# Patient Record
Sex: Male | Born: 1990 | Race: White | Hispanic: No | Marital: Single | Smoking: Current every day smoker
Health system: Southern US, Community
[De-identification: ages and names within clinical notes are randomized; demographics above are authoritative.]

---

## 2015-03-24 ENCOUNTER — Encounter (HOSPITAL_COMMUNITY): Payer: Self-pay | Admitting: Emergency Medicine

## 2015-03-24 ENCOUNTER — Emergency Department (HOSPITAL_COMMUNITY)
Admission: EM | Admit: 2015-03-24 | Discharge: 2015-03-24 | Disposition: A | Discharge status: Home / Self Care | Payer: Self-pay | Attending: Emergency Medicine | Admitting: Emergency Medicine

## 2015-03-24 DIAGNOSIS — H66001 Acute suppurative otitis media without spontaneous rupture of ear drum, right ear: Secondary | ICD-10-CM | POA: Insufficient documentation

## 2015-03-24 DIAGNOSIS — Z720 Tobacco use: Secondary | ICD-10-CM | POA: Insufficient documentation

## 2015-03-24 DIAGNOSIS — J029 Acute pharyngitis, unspecified: Secondary | ICD-10-CM | POA: Insufficient documentation

## 2015-03-24 MED ORDER — AMOXICILLIN 500 MG PO CAPS: 500.0000 mg | ORAL_CAPSULE | Freq: Three times a day (TID) | ORAL | Status: AC

## 2015-03-24 MED ORDER — ACETAMINOPHEN 500 MG PO TABS
1000.0000 mg | ORAL_TABLET | Freq: Once | ORAL | Status: AC
  Administered 2015-03-24: 1000 mg via ORAL
  Filled 2015-03-24: qty 2

## 2015-03-24 NOTE — ED Provider Notes (Signed)
CSN: [id number]     Arrival date & time 03/24/15  1034 History   First MD Initiated Contact with Patient 03/24/15 1123     Chief Complaint  Patient presents with  . Otalgia     (Consider location/radiation/quality/duration/timing/severity/associated sxs/prior Treatment) Patient is a 24 y.o. male presenting with ear pain. The history is provided by the patient and medical records. No language interpreter was used.  Otalgia Associated symptoms: congestion, headaches, rhinorrhea and sore throat   Associated symptoms: no abdominal pain, no cough, no diarrhea, no ear discharge, no fever, no rash and no vomiting      Steve Fischer is a 24 y.o. male  with no major medical history presents to the Emergency Department complaining of gradual, persistent, progressively worsening right ear otalgia onset this morning. Patient reports that he has had URI symptoms for the last 3 or 4 days with associated headaches, rhinorrhea, sinus congestion, and sore throat. He reports taking ibuprofen for this with moderate relief until this morning. Patient reports a distant history of right ear infection with complicating mastoiditis. He reports no ear infections since that time. He denies fevers at home and is afebrile on arrival here in the emergency department.  He's had no nausea or vomiting. He reports no difficulty swallowing today and improvement in his sore throat. Nothing seems to make his ear pain better or worse. He denies neck pain, neck stiffness, changes in vision, chest pain, shortness of breath abdominal pain, nausea, vomiting, diarrhea.  History reviewed. No pertinent past medical history. History reviewed. No pertinent past surgical history. No family history on file. History  Substance Use Topics  . Smoking status: Current Every Day Smoker  . Smokeless tobacco: Not on file  . Alcohol Use: No    Review of Systems  Constitutional: Negative for fever, chills, appetite change and fatigue.   HENT: Positive for congestion, ear pain, postnasal drip, rhinorrhea, sinus pressure and sore throat. Negative for ear discharge and mouth sores.   Eyes: Negative for visual disturbance.  Respiratory: Negative for cough, chest tightness, shortness of breath, wheezing and stridor.   Cardiovascular: Negative for chest pain, palpitations and leg swelling.  Gastrointestinal: Negative for nausea, vomiting, abdominal pain and diarrhea.  Genitourinary: Negative for dysuria, urgency, frequency and hematuria.  Musculoskeletal: Negative for myalgias, back pain, arthralgias and neck stiffness.  Skin: Negative for rash.  Neurological: Positive for headaches. Negative for syncope, light-headedness and numbness.  Hematological: Negative for adenopathy.  Psychiatric/Behavioral: The patient is not nervous/anxious.   All other systems reviewed and are negative.     Allergies  Review of patient's allergies indicates no known allergies.  Home Medications   Prior to Admission medications   Medication Sig Start Date End Date Taking? Authorizing Provider  acetaminophen (TYLENOL) 500 MG tablet Take 1,000 mg by mouth every 6 (six) hours as needed for moderate pain.   Yes Historical Provider, MD  ibuprofen (ADVIL,MOTRIN) 200 MG tablet Take 600-800 mg by mouth every 6 (six) hours as needed for moderate pain.    Yes Historical Provider, MD  amoxicillin (AMOXIL) 500 MG capsule Take 1 capsule (500 mg total) by mouth 3 (three) times daily. 03/24/15   Aziz Slape, PA-C   BP 136/80 mmHg  Pulse 91  Temp(Src) 98.7 F (37.1 C) (Oral)  Resp 14  Ht 6' (1.829 m)  Wt 230 lb (104.327 kg)  BMI 31.19 kg/m2  SpO2 95% Physical Exam  Constitutional: He is oriented to person, place, and time. He appears well-developed and  well-nourished. No distress.  HENT:  Head: Normocephalic and atraumatic.  Right Ear: External ear and ear canal normal. No mastoid tenderness. Tympanic membrane is injected and bulging. A middle  ear effusion is present. No hemotympanum.  Left Ear: Tympanic membrane, external ear and ear canal normal. No mastoid tenderness. Tympanic membrane is not injected and not bulging.  No middle ear effusion. No hemotympanum.  Nose: Mucosal edema and rhinorrhea present. No epistaxis. Right sinus exhibits no maxillary sinus tenderness and no frontal sinus tenderness. Left sinus exhibits no maxillary sinus tenderness and no frontal sinus tenderness.  Mouth/Throat: Uvula is midline and mucous membranes are normal. Mucous membranes are not pale, not dry and not cyanotic. No trismus in the jaw. No uvula swelling. No oropharyngeal exudate, posterior oropharyngeal edema, posterior oropharyngeal erythema or tonsillar abscesses.  Posterior oropharynx with erythema and mild edema but without exudate on the tonsils Right TM with purulent effusion, erythema and bulging. No erythema or tenderness to palpation of the mastoid No tenderness to palpation or movement of the tragus  Eyes: Conjunctivae are normal. Pupils are equal, round, and reactive to light.  Neck: Normal range of motion, full passive range of motion without pain and phonation normal. No tracheal tenderness, no spinous process tenderness and no muscular tenderness present. No rigidity. No erythema and normal range of motion present. No Brudzinski's sign and no Kernig's sign noted.  Range of motion without pain  No midline or paraspinal tenderness Normal phonation No stridor Handling secretions without difficulty No nuchal rigidity or meningeal signs  Cardiovascular: Normal rate, regular rhythm, normal heart sounds and intact distal pulses.   Pulses:      Radial pulses are 2+ on the right side, and 2+ on the left side.  Pulmonary/Chest: Effort normal and breath sounds normal. No stridor. No respiratory distress. He has no decreased breath sounds. He has no wheezes.  Clear and equal breath sounds without focal wheezes, rhonchi, rales  Abdominal:  Soft. Bowel sounds are normal. There is no tenderness.  Musculoskeletal: Normal range of motion.  Lymphadenopathy:       Head (right side): Submandibular and tonsillar adenopathy present. No submental, no preauricular, no posterior auricular and no occipital adenopathy present.       Head (left side): Submandibular and tonsillar adenopathy present. No submental, no preauricular, no posterior auricular and no occipital adenopathy present.    He has no cervical adenopathy.       Right cervical: No superficial cervical, no deep cervical and no posterior cervical adenopathy present.      Left cervical: No superficial cervical, no deep cervical and no posterior cervical adenopathy present.  Neurological: He is alert and oriented to person, place, and time.  Alert and oriented Moves all extremities without ataxia  Skin: Skin is warm and dry. No rash noted. He is not diaphoretic.  Psychiatric: He has a normal mood and affect.  Nursing note and vitals reviewed.   ED Course  Procedures (including critical care time) Labs Review Labs Reviewed - No data to display  Imaging Review No results found.   EKG Interpretation None      MDM   Final diagnoses:  Acute suppurative otitis media of right ear without spontaneous rupture of tympanic membrane, recurrence not specified   Arturo MortonGilbert Dauphine presents with URI symptoms for several days with onset of otalgia today. Patient reports history of complicating mastoiditis however there is no evidence of this today on exam. He is afebrile without tenderness or erythema to the  mastoid. Patient with obvious right otitis media.  No antibiotics in the last 6 months. Will give amoxicillin.  Recommended Tylenol and ibuprofen for pain and fever control. Strict return precautions given.  Patient is to follow-up with his primary care provider within 2 days for reassessment. If he is unable to do this he should present to an urgent care or back to the emergency  department for recheck.  BP 136/80 mmHg  Pulse 91  Temp(Src) 98.7 F (37.1 C) (Oral)  Resp 14  Ht 6' (1.829 m)  Wt 230 lb (104.327 kg)  BMI 31.19 kg/m2  SpO2 95%   Dierdre Forth, PA-C 03/24/15 1210  Toy Cookey, MD 03/24/15 2117

## 2015-03-24 NOTE — Discharge Instructions (Signed)
1. Medications: Amoxicillin, usual home medications 2. Treatment: rest, drink plenty of fluids, tylenol and ibuprofen as needed for pain and fever control 3. Follow Up: Please followup with your primary doctor in 2 days for recheck of your ear; if you do not have a primary care doctor use the resource guide provided to find one; Please return to the ER for high fever, purulent ear drainage, worsening symptoms or other concerns    Otitis Media With Effusion Otitis media with effusion is the presence of fluid in the middle ear. This is a common problem in children, which often follows ear infections. It may be present for weeks or longer after the infection. Unlike an acute ear infection, otitis media with effusion refers only to fluid behind the ear drum and not infection. Children with repeated ear and sinus infections and allergy problems are the most likely to get otitis media with effusion. CAUSES  The most frequent cause of the fluid buildup is dysfunction of the eustachian tubes. These are the tubes that drain fluid in the ears to the back of the nose (nasopharynx). SYMPTOMS   The main symptom of this condition is hearing loss. As a result, you or your child may:  Listen to the TV at a loud volume.  Not respond to questions.  Ask "what" often when spoken to.  Mistake or confuse one sound or word for another.  There may be a sensation of fullness or pressure but usually not pain. DIAGNOSIS   Your health care provider will diagnose this condition by examining you or your child's ears.  Your health care provider may test the pressure in you or your child's ear with a tympanometer.  A hearing test may be conducted if the problem persists. TREATMENT   Treatment depends on the duration and the effects of the effusion.  Antibiotics, decongestants, nose drops, and cortisone-type drugs (tablets or nasal spray) may not be helpful.  Children with persistent ear effusions may have  delayed language or behavioral problems. Children at risk for developmental delays in hearing, learning, and speech may require referral to a specialist earlier than children not at risk.  You or your child's health care provider may suggest a referral to an ear, nose, and throat surgeon for treatment. The following may help restore normal hearing:  Drainage of fluid.  Placement of ear tubes (tympanostomy tubes).  Removal of adenoids (adenoidectomy). HOME CARE INSTRUCTIONS   Avoid secondhand smoke.  Infants who are breastfed are less likely to have this condition.  Avoid feeding infants while they are lying flat.  Avoid known environmental allergens.  Avoid people who are sick. SEEK MEDICAL CARE IF:   Hearing is not better in 3 months.  Hearing is worse.  Ear pain.  Drainage from the ear.  Dizziness. MAKE SURE YOU:   Understand these instructions.  Will watch your condition.  Will get help right away if you are not doing well or get worse. Document Released: 12/29/2004 Document Revised: 04/07/2014 Document Reviewed: 06/18/2013 [facility] Patient Information 2015 [place], [place]. This information is not intended to replace advice given to you by your health care provider. Make sure you discuss any questions you have with your health care provider.    Emergency Department Resource Guide 1) Find a Doctor and Pay Out of Pocket Although you won't have to find out who is covered by your insurance plan, it is a good idea to ask around and get recommendations. You will then need to call the office  and see if the doctor you have chosen will accept you as a new patient and what types of options they offer for patients who are self-pay. Some doctors offer discounts or will set up payment plans for their patients who do not have insurance, but you will need to ask so you aren't surprised when you get to your appointment.  2) Contact Your Local Health Department Not all health  departments have doctors that can see patients for sick visits, but many do, so it is worth a call to see if yours does. If you don't know where your local health department is, you can check in your phone book. The CDC also has a tool to help you locate your state's health department, and many state websites also have listings of all of their local health departments.  3) Find a Walk-in Clinic If your illness is not likely to be very severe or complicated, you may want to try a walk in clinic. These are popping up all over the country in pharmacies, drugstores, and shopping centers. They're usually staffed by nurse practitioners or physician assistants that have been trained to treat common illnesses and complaints. They're usually fairly quick and inexpensive. However, if you have serious medical issues or chronic medical problems, these are probably not your best option.  No Primary Care Doctor: - Call Health Connect at  [phone] - they can help you locate a primary care doctor that  accepts your insurance, provides certain services, etc. - Physician Referral Service- [phone]  Chronic Pain Problems: Organization         Address  Phone   Notes  [facility]  [phone]) [phone] Patients need to be referred by their primary care doctor.   Medication Assistance: Organization         Address  Phone   Notes  [facility] [address] [place], [place] [address] [phone] --Must be a resident of [place] -- Must have NO insurance coverage whatsoever (no Medicaid/ Medicare, etc.) -- The pt. MUST have a primary care doctor that directs their care regularly and follows them in the community   [facility]ist  [phone]) [phone]   [employer]  [phone]) [phone]    Agencies that provide inexpensive medical care: Organization         Address  Phone   Notes  [facility]  [phone]) [phone]   [facility]     [phone]) [phone]   [facility] [address] [place], [place] [address] [phone]   [facility] [address], [place] [phone]   [facility]anned Parenthood    [phone]   [facility]    [phone]) [phone]   [facility]  [address], [place] Phone:  [phone], Fax:  [phone]) [phone] Hours of Operation:  9 am - 6 pm, M-F.  Also accepts Medicaid/Medicare and self-pay.  [facility]  [address], [place] Phone: [phone]) [phone], Fax: [phone]. Hours of Operation:  8:30 am - 5:30 pm, M-F.  Also accepts Medicaid and self-pay.  [facility] [place] [address], [place] Phone: [phone]) [phone]   [facility] [address], [place], [place] [phone], Ext. [phone] Mondays & Thursdays: 7-9 AM.  First 15 patients are seen on a first come, first serve basis.    Medicaid-accepting [place] Providers:  Organization         Address  Phone   Notes  [facility] [address], [place] [phone] Also accepts self-pay patients.  [facility] [address] Laurell Josephs [place], [place]  [phone]   [facility] [address], [place] [phone]   [facility] [address], [place] [phone]   Renaye Rakers [address], [place]   [phone] Only accepts [place] Access [place] patients after they have their name applied to their card.   Self-Pay (no insurance) in [facility]:  Organization         Address  Phone   Notes  Sickle Cell Patients, [facility] Internal Medicine [address] [place], [place] [phone]   [facility] Urgent Care [address] [place], [place] [phone]   [facility]  [address], [place] [phone]     [facility]/Dr. Osei-Bonsu  [address] [place] or [address], [place] [phone] Phone number for both [place] and [place] locations is the same.  [facility]gent [facility] and [facility] [address], [place] [phone]   [facility] [address], [place] or [address] [phone]   [facility] [address] [place] [address] [place] [phone], phone; [phone], fax Sees patients 1st and 3rd Saturday of every month.  Must not qualify for public or private insurance (i.e. Medicaid, Medicare, [place] Health Choice, Veterans' Benefits)  Household income should be no more than 200% of the poverty level The clinic cannot treat you if you are pregnant or think you are pregnant  Sexually transmitted diseases are not treated at the clinic.    Dental Care: Organization         Address  Phone  Notes  [facility] [address] [place], [place] [phone] Accepts children up to age 54 who are enrolled in [place] or [place] Health Choice; pregnant women with a Medicaid card; and children who have applied for Medicaid or [place] Health Choice, but were declined, whose parents can pay a reduced fee at time of service.  [facility] Department of [facility]  [address], [place] [phone] Accepts children up to age 41 who are enrolled in [place] or [place] Health Choice; pregnant women with a Medicaid card; and children who have applied for Medicaid or  Health Choice, but were declined, whose parents can pay a reduced fee at time of service.  [facility]  [address] [place], [place] [phone] Patients are seen by appointment only. Walk-ins are not accepted. [facility] will see patients 26 years of age and older. Monday - Tuesday (8am-5pm) Most Wednesdays (8:30-5pm) $30 per visit,  cash only  [facility] Access PRO[facility]  [address], [facility] [phone] Patients are seen by appointment only. Walk-ins are not accepted. [facility] will see patients 62 years of age and older. One Wednesday Evening (Monthly: Volunteer Based).  $30 per visit, cash only  [employer]  [phone] for adults; Children under age 49, call Graduate Pediatric Dentistry at [phone]. Children aged 50-14, please call [phone] to request a pediatric  application.  Dental services are provided in all areas of dental care including fillings, crowns and bridges, complete and partial dentures, implants, gum treatment, root canals, and extractions. Preventive care is also provided. Treatment is provided to both adults and children. Patients are selected via a lottery and there is often a waiting list.   [facility] [address], [place]  [phone] [email or web address]   [facility] [address] [place], [place] [phone], Ext. [phone] Second and Fourth Thursday of each month, opens at 6:30 AM; Clinic ends at 9 AM.  Patients are seen on a first-come first-served basis, and a limited number are seen during each clinic.   [facility]  [address] Ether Griffins [place], [place] [phone]   Eligibility Requirements You must have lived in [place], [place], or [place] counties for at least the last three months.   You cannot be eligible for state or federal sponsored [place], including [employer], [place], or [employer].   You generally cannot be eligible for healthcare insurance through your employer.    How to apply: Eligibility screenings are held every Tuesday and Wednesday afternoon from 1:00 pm until 4:00 pm. You do not need an appointment for the interview!  [facility] [address], [place], [place] [phone]   [facility] Health Department   [phone]   [facility] Health Department  [phone]   [facility]  [phone]    Behavioral Health Resources in the Community: Intensive Outpatient Programs Organization         Address  Phone  Notes  [facility] Services [address], [place], [place] [phone]   [facility] Outpatient [address], [place], [place] [phone]   [facility] [address], [place], [place]  [phone]   [facility] Mental Health [address]  [place], [place] [phone] or [phone]   Substance Abuse Resources Organization         Address  Phone  Notes  [facility] Drug Services  [phone]   [facility]  [phone]   [facility] [place]  [phone]   Floydene Flock  [phone]   Residential & Outpatient Substance Abuse Program  [phone]   Psychological Services Organization         Address  Phone  Notes  [facility]  [phone]   [facility] Services  [phone]   [facility] [address] [place] [phone] or [phone]    Mobile Crisis Teams Organization         Address  Phone  Notes  [facility], Mobile Crisis Care Unit  [phone]   [facility]  [address] [place], [place] [phone]   Doristine Locks [address] [place] [phone]    Self-Help/Support Groups Organization         Address  Phone             Notes  [facility] [place] - variety of support groups  [phone]- I7437963 Call for more information  Narcotics Anonymous (NA), [facility] [address], [employer] [place]  2 meetings at this location   Statistician         Address  Phone  Notes  [facility] [address],    [place]  [phone]   [facility]  [address], [place] [phone], [place],  [facility] [phone]    [facility] Residential Treatment Facility [address] [place], [place] [phone] Admissions: 8am-3pm M-F  [facility] [address],    [place], [place] [phone]   [facility] [address] [place], [place], [place] [phone]   [facility] [address],  [place], [place] [phone]   [facility] - Intensive Outpatient [address], [place], [place] [phone]   [facility] ([facility] [address] [place]  [place], [place] [phone] or [phone]   [facility] ([facility]) [address], [place], [place] [phone] Accepts Medicaid  [facility] [address]  [place] [phone] Substance Abuse/Addiction Treatment   [facility] Organization         Address  Phone  Notes  [facility]  [phone]   Angie Fava, PhD [address] Ervin Knack [place], [place]   [phone] or [phone]   [facility]   [address] [place], [place] [phone]   [facility] [address], [place], [place] [phone] Insurance/Medicaid/sponsorship through [facility] and Families [address]                                    [place], [place] [phone] Therapy/tele-psych/case  [facility] [address], [place] [phone]    Dr. Lolly Mustache  [phone]   [facility]  United [employer] [facility] Dept. 1) [address] [place] 2) [address] [place] 3)  [address], [place] [phone]  [phone]   [facility] Child Abuse Hotline [phone] or [phone] (After Hours)

## 2015-03-24 NOTE — ED Notes (Signed)
Pt reports right ear pain that started this am when he woke but has progressively become worse. Pt with recent sinus congestion and cough.

## 2015-07-22 ENCOUNTER — Encounter (HOSPITAL_COMMUNITY): Payer: Self-pay | Admitting: Emergency Medicine

## 2015-07-22 ENCOUNTER — Emergency Department (HOSPITAL_COMMUNITY)
Admission: EM | Admit: 2015-07-22 | Discharge: 2015-07-22 | Disposition: A | Discharge status: Home / Self Care | Payer: Self-pay | Attending: Emergency Medicine | Admitting: Emergency Medicine

## 2015-07-22 ENCOUNTER — Emergency Department (HOSPITAL_COMMUNITY): Payer: Self-pay

## 2015-07-22 DIAGNOSIS — Z720 Tobacco use: Secondary | ICD-10-CM | POA: Insufficient documentation

## 2015-07-22 DIAGNOSIS — Z79899 Other long term (current) drug therapy: Secondary | ICD-10-CM | POA: Insufficient documentation

## 2015-07-22 DIAGNOSIS — J40 Bronchitis, not specified as acute or chronic: Secondary | ICD-10-CM | POA: Insufficient documentation

## 2015-07-22 DIAGNOSIS — R1110 Vomiting, unspecified: Secondary | ICD-10-CM | POA: Insufficient documentation

## 2015-07-22 LAB — URINALYSIS, ROUTINE W REFLEX MICROSCOPIC
Bilirubin Urine: NEGATIVE
Glucose, UA: NEGATIVE mg/dL
HGB URINE DIPSTICK: NEGATIVE
Ketones, ur: NEGATIVE mg/dL
LEUKOCYTES UA: NEGATIVE
Nitrite: NEGATIVE
PROTEIN: NEGATIVE mg/dL
SPECIFIC GRAVITY, URINE: 1.03 (ref 1.005–1.030)
UROBILINOGEN UA: 0.2 mg/dL (ref 0.0–1.0)
pH: 5.5 (ref 5.0–8.0)

## 2015-07-22 LAB — COMPREHENSIVE METABOLIC PANEL
ALBUMIN: 4.7 g/dL (ref 3.5–5.0)
ALT: 38 U/L (ref 17–63)
ANION GAP: 10 (ref 5–15)
AST: 31 U/L (ref 15–41)
Alkaline Phosphatase: 64 U/L (ref 38–126)
BILIRUBIN TOTAL: 0.8 mg/dL (ref 0.3–1.2)
BUN: 22 mg/dL — ABNORMAL HIGH (ref 6–20)
CO2: 26 mmol/L (ref 22–32)
Calcium: 9.6 mg/dL (ref 8.9–10.3)
Chloride: 102 mmol/L (ref 101–111)
Creatinine, Ser: 0.94 mg/dL (ref 0.61–1.24)
GFR calc non Af Amer: >60 mL/min (ref >60)
Glucose, Bld: 92 mg/dL (ref 65–99)
Potassium: 4.1 mmol/L (ref 3.5–5.1)
Sodium: 138 mmol/L (ref 135–145)
TOTAL PROTEIN: 9.2 g/dL — AB (ref 6.5–8.1)

## 2015-07-22 LAB — CBC
HCT: 47.5 % (ref 39.0–52.0)
HEMOGLOBIN: 16 g/dL (ref 13.0–17.0)
MCH: 30.8 pg (ref 26.0–34.0)
MCHC: 33.7 g/dL (ref 30.0–36.0)
MCV: 91.3 fL (ref 78.0–100.0)
Platelets: 274 10*3/uL (ref 150–400)
RBC: 5.2 MIL/uL (ref 4.22–5.81)
RDW: 12.5 % (ref 11.5–15.5)
WBC: 10.1 10*3/uL (ref 4.0–10.5)

## 2015-07-22 LAB — RAPID STREP SCREEN (MED CTR MEBANE ONLY): STREPTOCOCCUS, GROUP A SCREEN (DIRECT): NEGATIVE

## 2015-07-22 LAB — LIPASE, BLOOD: Lipase: 28 U/L (ref 22–51)

## 2015-07-22 MED ORDER — DEXTROMETHORPHAN HBR 15 MG/5ML PO SYRP: 10.0000 mL | ORAL_SOLUTION | Freq: Four times a day (QID) | ORAL | Status: AC | PRN

## 2015-07-22 MED ORDER — ALBUTEROL SULFATE HFA 108 (90 BASE) MCG/ACT IN AERS: 1.0000 | INHALATION_SPRAY | Freq: Four times a day (QID) | RESPIRATORY_TRACT | Status: AC | PRN

## 2015-07-22 MED ORDER — PSEUDOEPHEDRINE HCL 30 MG PO TABS: 30.0000 mg | ORAL_TABLET | ORAL | Status: AC | PRN

## 2015-07-22 MED ORDER — ONDANSETRON 4 MG PO TBDP: 4.0000 mg | ORAL_TABLET | Freq: Once | ORAL | Status: DC

## 2015-07-22 NOTE — ED Provider Notes (Signed)
CSN: [id number]     Arrival date & time 07/22/15  1810 History   First MD Initiated Contact with Patient 07/22/15 1951     Chief Complaint  Patient presents with  . Emesis  . Sore Throat     (Consider location/radiation/quality/duration/timing/severity/associated sxs/prior Treatment) HPI Steve Fischer is a 24 y.o. male with no medical problems, presents to emergency department complaining of sore throat, nasal congestion, cough, post tussive emesis. Patient states symptoms started yesterday. Patient states "I have a world war 7 and my body." Patient states he has been taking over-the-counter cough syrup with no relief of his symptoms. Patient has not tried any other medications. Patient states that his roommate was recently sick with a GI bug. Patient denies abdominal pain or diarrhea. He states he is vomiting only after coughing. Patient denies any fever or chills. No headache or neck stiffness. No back pain. Denies shortness of breath.  History reviewed. No pertinent past medical history. History reviewed. No pertinent past surgical history. No family history on file. Social History  Substance Use Topics  . Smoking status: Current Every Day Smoker    Types: Cigarettes  . Smokeless tobacco: None  . Alcohol Use: No    Review of Systems  Constitutional: Negative for fever and chills.  HENT: Positive for congestion and sore throat. Negative for ear pain and voice change.   Respiratory: Positive for cough. Negative for chest tightness and shortness of breath.   Cardiovascular: Negative for chest pain, palpitations and leg swelling.  Gastrointestinal: Positive for vomiting. Negative for nausea, abdominal pain, diarrhea and abdominal distention.  Genitourinary: Negative for dysuria, urgency, frequency and hematuria.  Musculoskeletal: Negative for myalgias, arthralgias, neck pain and neck stiffness.  Skin: Negative for rash.  Allergic/Immunologic: Negative for immunocompromised state.   Neurological: Negative for dizziness, weakness, light-headedness, numbness and headaches.  All other systems reviewed and are negative.     Allergies  Review of patient's allergies indicates no known allergies.  Home Medications   Prior to Admission medications   Medication Sig Start Date End Date Taking? Authorizing Provider  guaifenesin (COUGH SYRUP) 100 MG/5ML syrup Take 200 mg by mouth 3 (three) times daily as needed for cough.   Yes Historical Provider, MD  pseudoephedrine (SUDAFED) 30 MG tablet Take 30 mg by mouth every 4 (four) hours as needed for congestion.   Yes Historical Provider, MD  albuterol (PROVENTIL HFA;VENTOLIN HFA) 108 (90 BASE) MCG/ACT inhaler Inhale 1-2 puffs into the lungs every 6 (six) hours as needed for wheezing or shortness of breath. 07/22/15   Jushua Waltman, PA-C  amoxicillin (AMOXIL) 500 MG capsule Take 1 capsule (500 mg total) by mouth 3 (three) times daily. Patient not taking: Reported on 07/22/2015 03/24/15   Dahlia Client Muthersbaugh, PA-C  dextromethorphan 15 MG/5ML syrup Take 10 mLs (30 mg total) by mouth 4 (four) times daily as needed for cough. 07/22/15   Toris Laverdiere, PA-C  pseudoephedrine (SUDAFED) 30 MG tablet Take 1 tablet (30 mg total) by mouth every 4 (four) hours as needed for congestion. 07/22/15   Kiran Carline, PA-C   BP 135/86 mmHg  Pulse 96  Temp(Src) 98.6 F (37 C) (Oral)  Resp 17  Ht 6' (1.829 m)  Wt 230 lb (104.327 kg)  BMI 31.19 kg/m2  SpO2 99% Physical Exam  Constitutional: He appears well-developed and well-nourished. No distress.  HENT:  Head: Normocephalic and atraumatic.  Right Ear: Tympanic membrane, external ear and ear canal normal.  Left Ear: External ear and ear canal  normal.  Nose: Mucosal edema and rhinorrhea present.  Mouth/Throat: Uvula is midline and mucous membranes are normal. Posterior oropharyngeal erythema present. No oropharyngeal exudate or posterior oropharyngeal edema.  Eyes: Conjunctivae are  normal.  Neck: Neck supple.  Cardiovascular: Normal rate, regular rhythm and normal heart sounds.   Pulmonary/Chest: Effort normal. No respiratory distress. He has no wheezes. He has no rales.  Abdominal: Soft. Bowel sounds are normal. He exhibits no distension. There is no tenderness. There is no rebound.  Musculoskeletal: He exhibits no edema.  Neurological: He is alert.  Skin: Skin is warm and dry.  Nursing note and vitals reviewed.   ED Course  Procedures (including critical care time) Labs Review Labs Reviewed  COMPREHENSIVE METABOLIC PANEL - Abnormal; Notable for the following:    BUN 22 (*)    Total Protein 9.2 (*)    All other components within normal limits  RAPID STREP SCREEN (NOT AT [facility])  CULTURE, GROUP A STREP  LIPASE, BLOOD  CBC  URINALYSIS, ROUTINE W REFLEX MICROSCOPIC (NOT AT [facility])    Imaging Review Dg Chest 2 View  07/22/2015   CLINICAL DATA:  Productive cough.  Mid chest pain.  EXAM: CHEST  2 VIEW  COMPARISON:  None.  FINDINGS: The heart size and mediastinal contours are within normal limits. Both lungs are clear. The visualized skeletal structures are unremarkable.  IMPRESSION: Normal chest.   Electronically Signed   By: Francene Boyers M.D.   On: 07/22/2015 20:42   I have personally reviewed and evaluated these images and lab results as part of my medical decision-making.   EKG Interpretation None      MDM   Final diagnoses:  Bronchitis   Patient with URI symptoms and posttussive emesis. Symptoms just started last night. Patient's lab work and strep was ordered by triage nurse. All resulted unremarkable. Strep is negative. Chest x-ray obtained to rule out pneumonia is negative. Patient's vital signs are normal. Most likely viral URI versus viral sinusitis. Will treat symptomatically with cough medication, Sudafed, inhaler. Patient instructed to stop smoking. Follow with primary care doctor as needed.  Filed Vitals:   07/22/15 1817  BP: 135/86  Pulse:  96  Temp: 98.6 F (37 C)  TempSrc: Oral  Resp: 17  Height: 6' (1.829 m)  Weight: 230 lb (104.327 kg)  SpO2: 99%         Jaynie Crumble, PA-C 07/22/15 2125  Laurence Spates, MD 07/23/15 1450

## 2015-07-22 NOTE — Discharge Instructions (Signed)
Sudafed for congestion. Cough medication as prescribed as needed. Albuterol 2 puffs every 4 hrs. Drink plenty of fluids. Rest. Follow up with primary care doctor.   Upper Respiratory Infection, Adult An upper respiratory infection (URI) is also sometimes known as the common cold. The upper respiratory tract includes the nose, sinuses, throat, trachea, and bronchi. Bronchi are the airways leading to the lungs. Most people improve within 1 week, but symptoms can last up to 2 weeks. A residual cough may last even longer.  CAUSES Many different viruses can infect the tissues lining the upper respiratory tract. The tissues become irritated and inflamed and often become very moist. Mucus production is also common. A cold is contagious. You can easily spread the virus to others by oral contact. This includes kissing, sharing a glass, coughing, or sneezing. Touching your mouth or nose and then touching a surface, which is then touched by another person, can also spread the virus. SYMPTOMS  Symptoms typically develop 1 to 3 days after you come in contact with a cold virus. Symptoms vary from person to person. They may include:  Runny nose.  Sneezing.  Nasal congestion.  Sinus irritation.  Sore throat.  Loss of voice (laryngitis).  Cough.  Fatigue.  Muscle aches.  Loss of appetite.  Headache.  Low-grade fever. DIAGNOSIS  You might diagnose your own cold based on familiar symptoms, since most people get a cold 2 to 3 times a year. Your caregiver can confirm this based on your exam. Most importantly, your caregiver can check that your symptoms are not due to another disease such as strep throat, sinusitis, pneumonia, asthma, or epiglottitis. Blood tests, throat tests, and X-rays are not necessary to diagnose a common cold, but they may sometimes be helpful in excluding other more serious diseases. Your caregiver will decide if any further tests are required. RISKS AND COMPLICATIONS  You may be  at risk for a more severe case of the common cold if you smoke cigarettes, have chronic heart disease (such as heart failure) or lung disease (such as asthma), or if you have a weakened immune system. The very young and very old are also at risk for more serious infections. Bacterial sinusitis, middle ear infections, and bacterial pneumonia can complicate the common cold. The common cold can worsen asthma and chronic obstructive pulmonary disease (COPD). Sometimes, these complications can require emergency medical care and may be life-threatening. PREVENTION  The best way to protect against getting a cold is to practice good hygiene. Avoid oral or hand contact with people with cold symptoms. Wash your hands often if contact occurs. There is no clear evidence that vitamin C, vitamin E, echinacea, or exercise reduces the chance of developing a cold. However, it is always recommended to get plenty of rest and practice good nutrition. TREATMENT  Treatment is directed at relieving symptoms. There is no cure. Antibiotics are not effective, because the infection is caused by a virus, not by bacteria. Treatment may include:  Increased fluid intake. Sports drinks offer valuable electrolytes, sugars, and fluids.  Breathing heated mist or steam (vaporizer or shower).  Eating chicken soup or other clear broths, and maintaining good nutrition.  Getting plenty of rest.  Using gargles or lozenges for comfort.  Controlling fevers with ibuprofen or acetaminophen as directed by your caregiver.  Increasing usage of your inhaler if you have asthma. Zinc gel and zinc lozenges, taken in the first 24 hours of the common cold, can shorten the duration and lessen the severity  of symptoms. Pain medicines may help with fever, muscle aches, and throat pain. A variety of non-prescription medicines are available to treat congestion and runny nose. Your caregiver can make recommendations and may suggest nasal or lung inhalers  for other symptoms.  HOME CARE INSTRUCTIONS   Only take over-the-counter or prescription medicines for pain, discomfort, or fever as directed by your caregiver.  Use a warm mist humidifier or inhale steam from a shower to increase air moisture. This may keep secretions moist and make it easier to breathe.  Drink enough water and fluids to keep your urine clear or pale yellow.  Rest as needed.  Return to work when your temperature has returned to normal or as your caregiver advises. You may need to stay home longer to avoid infecting others. You can also use a face mask and careful hand washing to prevent spread of the virus. SEEK MEDICAL CARE IF:   After the first few days, you feel you are getting worse rather than better.  You need your caregiver's advice about medicines to control symptoms.  You develop chills, worsening shortness of breath, or brown or red sputum. These may be signs of pneumonia.  You develop yellow or brown nasal discharge or pain in the face, especially when you bend forward. These may be signs of sinusitis.  You develop a fever, swollen neck glands, pain with swallowing, or white areas in the back of your throat. These may be signs of strep throat. SEEK IMMEDIATE MEDICAL CARE IF:   You have a fever.  You develop severe or persistent headache, ear pain, sinus pain, or chest pain.  You develop wheezing, a prolonged cough, cough up blood, or have a change in your usual mucus (if you have chronic lung disease).  You develop sore muscles or a stiff neck. Document Released: 05/17/2001 Document Revised: 02/13/2012 Document Reviewed: 02/26/2014 [facility] Patient Information 2015 [place], [place]. This information is not intended to replace advice given to you by your health care provider. Make sure you discuss any questions you have with your health care provider.

## 2015-07-22 NOTE — ED Notes (Signed)
Pt states that he vomited yesterday morning then went on to work. Pt states that after he got home yesterday he went to bed.  Pt states that he been vomiting today and "having world war 7 in my body".  Pt states that he has sore throat, cough productive with yellow mucous.  Pt denies diarrhea.

## 2015-07-24 LAB — CULTURE, GROUP A STREP: STREP A CULTURE: NEGATIVE

## 2015-09-15 ENCOUNTER — Emergency Department (HOSPITAL_COMMUNITY)
Admission: EM | Admit: 2015-09-15 | Discharge: 2015-09-16 | Disposition: A | Discharge status: Home / Self Care | Payer: Self-pay | Attending: Emergency Medicine | Admitting: Emergency Medicine

## 2015-09-15 ENCOUNTER — Encounter (HOSPITAL_COMMUNITY): Payer: Self-pay | Admitting: Emergency Medicine

## 2015-09-15 ENCOUNTER — Emergency Department (HOSPITAL_COMMUNITY): Payer: Self-pay

## 2015-09-15 DIAGNOSIS — M545 Low back pain, unspecified: Secondary | ICD-10-CM

## 2015-09-15 DIAGNOSIS — W500XXA Accidental hit or strike by another person, initial encounter: Secondary | ICD-10-CM | POA: Insufficient documentation

## 2015-09-15 DIAGNOSIS — S3992XA Unspecified injury of lower back, initial encounter: Secondary | ICD-10-CM

## 2015-09-15 DIAGNOSIS — Y999 Unspecified external cause status: Secondary | ICD-10-CM | POA: Insufficient documentation

## 2015-09-15 DIAGNOSIS — Y929 Unspecified place or not applicable: Secondary | ICD-10-CM | POA: Insufficient documentation

## 2015-09-15 DIAGNOSIS — Y939 Activity, unspecified: Secondary | ICD-10-CM | POA: Insufficient documentation

## 2015-09-15 DIAGNOSIS — Z720 Tobacco use: Secondary | ICD-10-CM | POA: Insufficient documentation

## 2015-09-15 MED ORDER — KETOROLAC TROMETHAMINE 30 MG/ML IJ SOLN
30.0000 mg | Freq: Once | INTRAMUSCULAR | Status: AC
  Administered 2015-09-15: 30 mg via INTRAVENOUS
  Filled 2015-09-15: qty 1

## 2015-09-15 MED ORDER — DEXAMETHASONE SODIUM PHOSPHATE 10 MG/ML IJ SOLN
10.0000 mg | Freq: Once | INTRAMUSCULAR | Status: AC
  Administered 2015-09-15: 10 mg via INTRAVENOUS
  Filled 2015-09-15: qty 1

## 2015-09-15 NOTE — ED Provider Notes (Signed)
CSN: [id number]     Arrival date & time 09/15/15  2155 History   First MD Initiated Contact with Patient 09/15/15 2156     Chief Complaint  Patient presents with  . Back Pain     (Consider location/radiation/quality/duration/timing/severity/associated sxs/prior Treatment) HPI Comments: Patient is a 24 year old male who presents with sudden onset of back pain that started when his girlfriend was "walking on his back" to relieve some back pain. Patient reports she stepped on his lower back and felt sudden onset of sharp pain and tingling that radiated down both of his legs. He denies loss of control of bowel or bladder. He states he can move his legs and the pain is improving since the initial onset. Palpation of the low back makes the pain worse. No alleviating factors.    History reviewed. No pertinent past medical history. History reviewed. No pertinent past surgical history. History reviewed. No pertinent family history. Social History  Substance Use Topics  . Smoking status: Current Every Day Smoker -- 0.50 packs/day    Types: Cigarettes  . Smokeless tobacco: None  . Alcohol Use: Yes     Comment: once a week     Review of Systems  Musculoskeletal: Positive for back pain.  All other systems reviewed and are negative.     Allergies  Review of patient's allergies indicates no known allergies.  Home Medications   Prior to Admission medications   Medication Sig Start Date End Date Taking? Authorizing Provider  albuterol (PROVENTIL HFA;VENTOLIN HFA) 108 (90 BASE) MCG/ACT inhaler Inhale 1-2 puffs into the lungs every 6 (six) hours as needed for wheezing or shortness of breath. 07/22/15   Tatyana Kirichenko, PA-C  amoxicillin (AMOXIL) 500 MG capsule Take 1 capsule (500 mg total) by mouth 3 (three) times daily. Patient not taking: Reported on 07/22/2015 03/24/15   Dahlia Client Muthersbaugh, PA-C  dextromethorphan 15 MG/5ML syrup Take 10 mLs (30 mg total) by mouth 4 (four) times daily as  needed for cough. 07/22/15   Tatyana Kirichenko, PA-C  guaifenesin (COUGH SYRUP) 100 MG/5ML syrup Take 200 mg by mouth 3 (three) times daily as needed for cough.    Historical Provider, MD  pseudoephedrine (SUDAFED) 30 MG tablet Take 30 mg by mouth every 4 (four) hours as needed for congestion.    Historical Provider, MD  pseudoephedrine (SUDAFED) 30 MG tablet Take 1 tablet (30 mg total) by mouth every 4 (four) hours as needed for congestion. 07/22/15   Tatyana Kirichenko, PA-C   BP 142/71 mmHg  Pulse 77  Temp(Src) 98 F (36.7 C) (Oral)  Resp 16  SpO2 97% Physical Exam  Constitutional: He is oriented to person, place, and time. He appears well-developed and well-nourished. No distress.  HENT:  Head: Normocephalic and atraumatic.  Eyes: Conjunctivae are normal.  Neck: Normal range of motion.  Cardiovascular: Normal rate and regular rhythm.  Exam reveals no gallop and no friction rub.   No murmur heard. Pulmonary/Chest: Effort normal and breath sounds normal. He has no wheezes. He has no rales. He exhibits no tenderness.  Abdominal: Soft. He exhibits no distension. There is no tenderness. There is no rebound.  Musculoskeletal: Normal range of motion.  L1 and L2 tender to palpation. No other midline spine tenderness.   Neurological: He is alert and oriented to person, place, and time. Coordination normal.  Speech is goal-oriented. Moves limbs without ataxia. Lower extremity strength and sensation equal and intact bilaterally.   Skin: Skin is warm and dry.  Psychiatric: He has a normal mood and affect. His behavior is normal.  Nursing note and vitals reviewed.   ED Course  Procedures (including critical care time) Labs Review Labs Reviewed - No data to display  Imaging Review Dg Lumbar Spine Complete  09/15/2015  CLINICAL DATA:  24 year old male with acute severe low back pain after blunt trauma. Initial encounter. EXAM: LUMBAR SPINE - COMPLETE 4+ VIEW COMPARISON:  None. FINDINGS:  Normal lumbar segmentation. Bone mineralization is within normal limits. Normal vertebral height and alignment. Relatively preserved disc spaces. 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 Sacral ala and SI joints within normal limits. No pars fracture. IMPRESSION: No acute osseous abnormality identified in lumbar spine. Electronically Signed   By: Odessa Fleming M.D.   On: 09/15/2015 22:53   I have personally reviewed and evaluated these images and lab results as part of my medical decision-making.   EKG Interpretation None      MDM   Final diagnoses:  Low back pain  Lower back injury, initial encounter    10:21 PM Patient will have toradol for pain. No neurologic deficits at this time. Lumbar spine xray pending. No other injury.   Lumbar spine xray unremarkable for acute changes. Patient able to ambulate with some pain. Patient will be discharged with prednisone taper, Percocet, and flexeril. Patient likely has acute inflammation from ligamentous injury with associated muscle spasms. No saddle paresthesias or bowel/bladder incontinence. Patient will be discharged without further evaluation.    Emilia Beck, PA-C 09/16/15 0340  Linwood Dibbles, MD 09/17/15 2156

## 2015-09-15 NOTE — ED Notes (Signed)
Pt reports that his back was sore so he had his girlfriend "walk on my back" as she had in the past.  As she stepped on his lower back he began to feel "more intense pain" and then "tingling down my legs."  He has sensation on both legs, reports it feels like "pins and needles".  No fall.  He was given of fental in the field.

## 2015-09-16 MED ORDER — CYCLOBENZAPRINE HCL 10 MG PO TABS: 10.0000 mg | ORAL_TABLET | Freq: Two times a day (BID) | ORAL | Status: AC | PRN

## 2015-09-16 MED ORDER — ONDANSETRON HCL 4 MG/2ML IJ SOLN
4.0000 mg | Freq: Once | INTRAMUSCULAR | Status: DC
  Filled 2015-09-16: qty 2

## 2015-09-16 MED ORDER — PREDNISONE 20 MG PO TABS: 40.0000 mg | ORAL_TABLET | Freq: Every day | ORAL | Status: AC

## 2015-09-16 MED ORDER — MORPHINE SULFATE (PF) 4 MG/ML IV SOLN
4.0000 mg | Freq: Once | INTRAVENOUS | Status: DC
  Filled 2015-09-16: qty 1

## 2015-09-16 MED ORDER — OXYCODONE-ACETAMINOPHEN 5-325 MG PO TABS: 1.0000 | ORAL_TABLET | ORAL | Status: AC | PRN

## 2015-09-16 NOTE — Discharge Instructions (Signed)
Take prednisone as directed until gone. Take Percocet as needed for pain. Take flexeril as needed for muscle spasm. You may take these medications together. Refer to attached documents for more information.

## 2017-02-22 IMAGING — CR DG LUMBAR SPINE COMPLETE 4+V
5 series · 5 of 5 positions shown · non-contrast
Comparison: None.

CLINICAL DATA: 23-year-old male with acute severe low back pain
after blunt trauma. Initial encounter.

EXAM:
LUMBAR SPINE - COMPLETE 4+ VIEW

[l-spine ap]
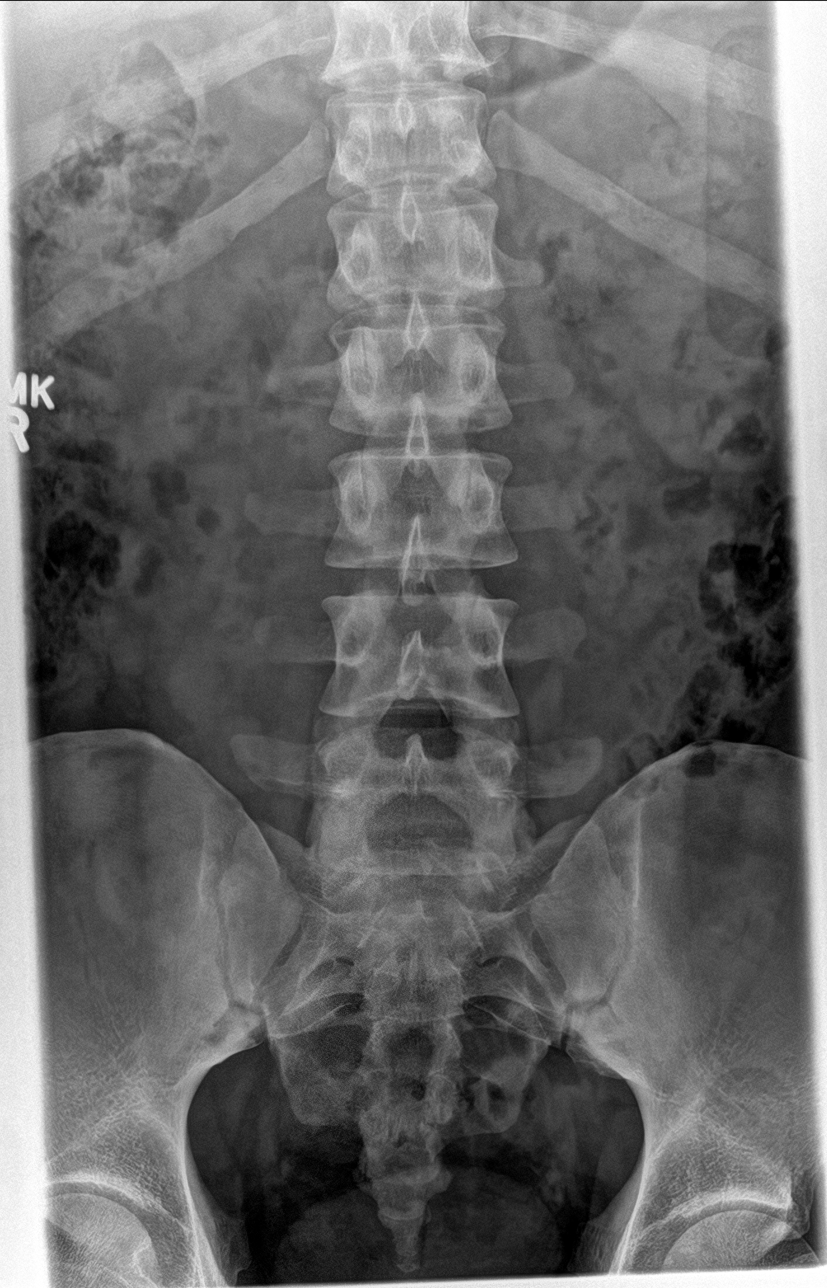

[l-spine obl (1 of 2)]
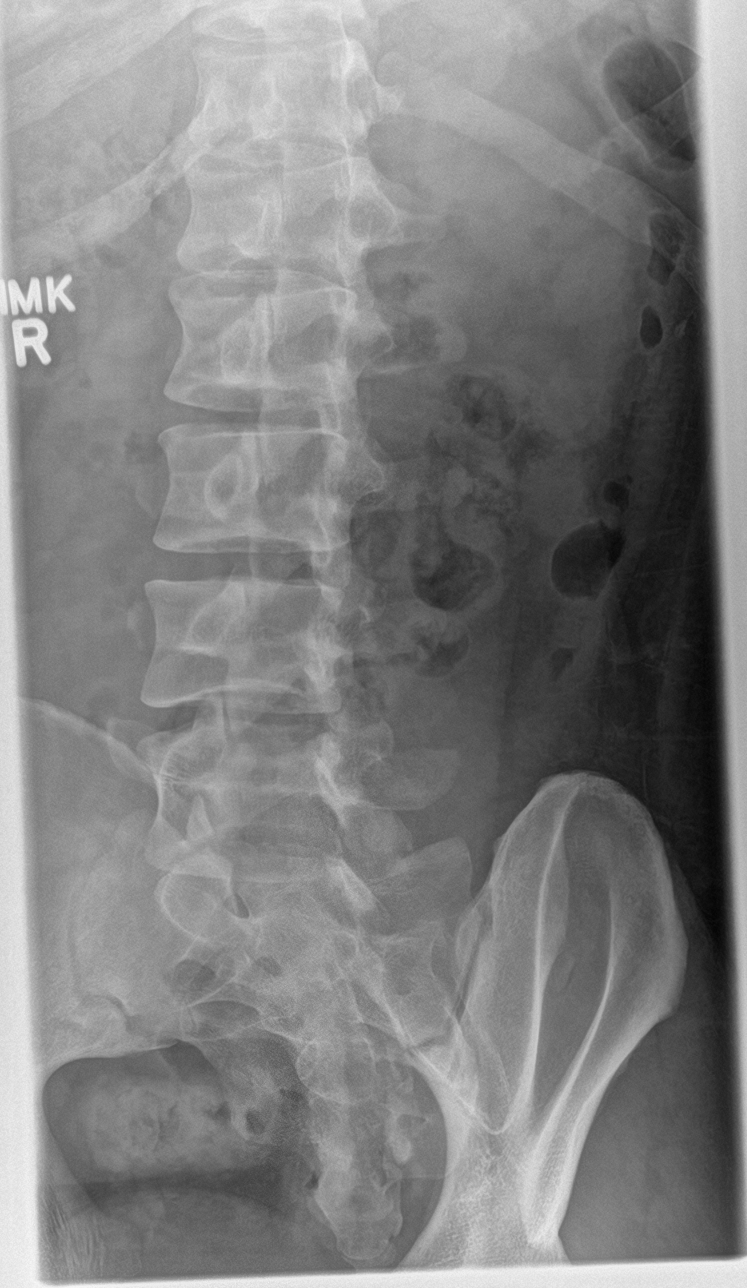

[l-spine obl (2 of 2)]
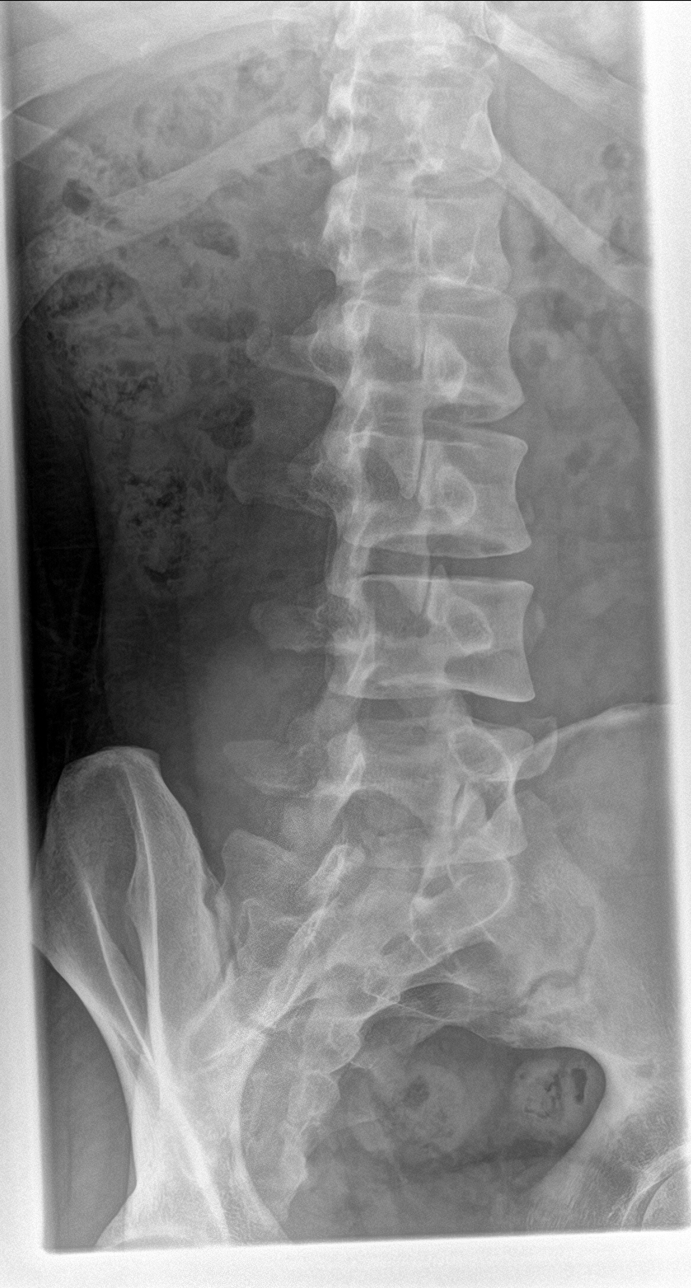

[l-spine lat]
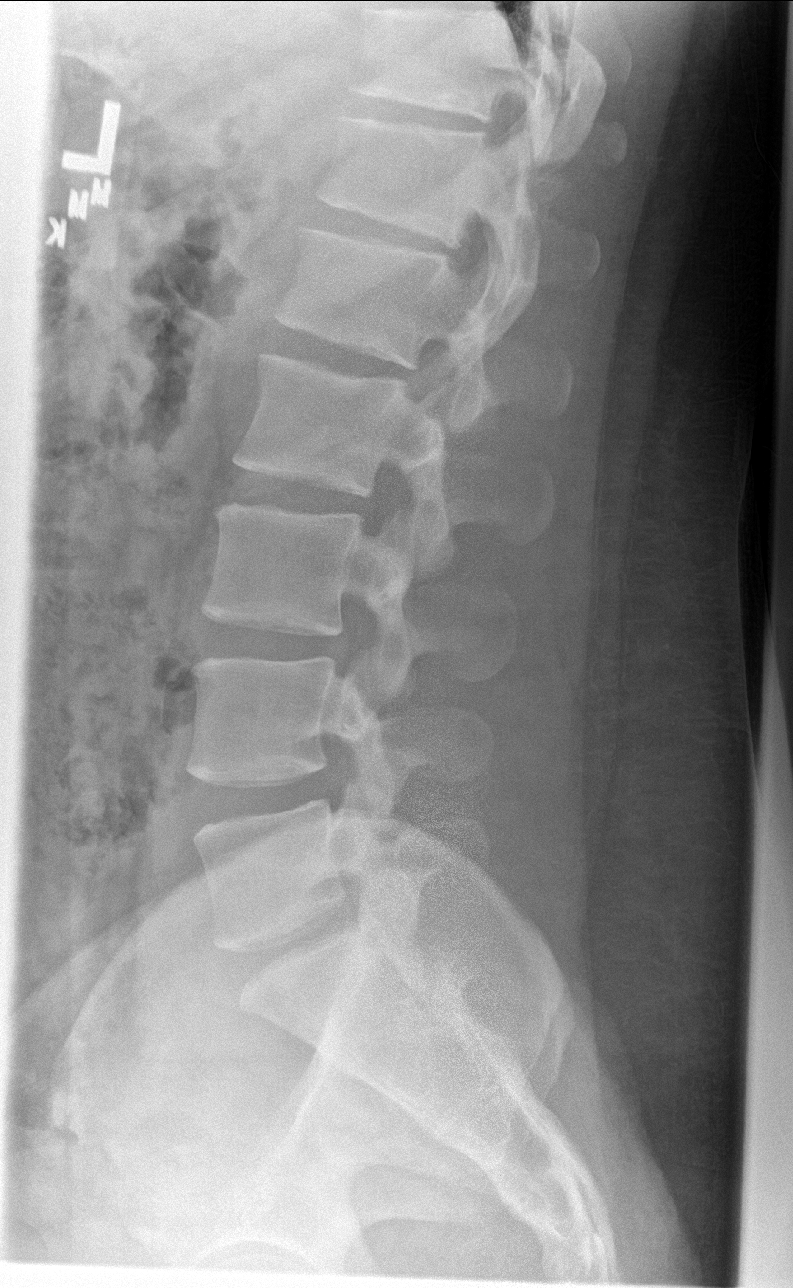

[l-spine spot]
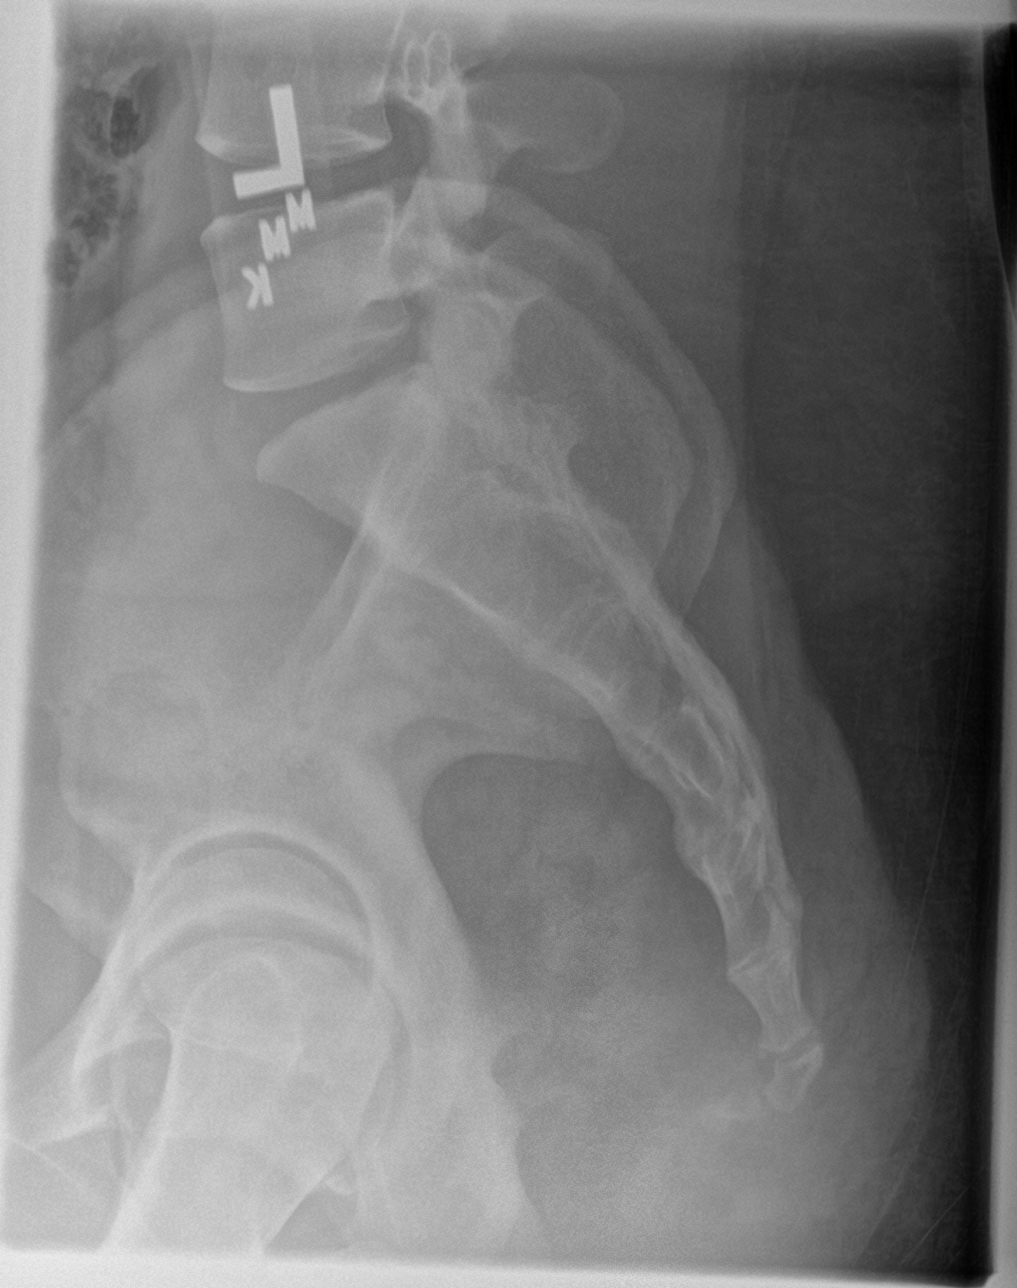

[5 of 5 positions shown; findings below may reference images not displayed]

FINDINGS: Normal lumbar segmentation. Bone mineralization is within normal
limits. Normal vertebral height and alignment. Relatively preserved
disc spaces. Mild congenital appearing wedging of T11 and T12.
Sacral ala and SI joints within normal limits. No pars fracture.
IMPRESSION: No acute osseous abnormality identified in lumbar spine.
